# Patient Record
Sex: Male | Born: 1960 | Race: White | Hispanic: No | Marital: Married
Health system: Southern US, Community
[De-identification: ages and names within clinical notes are randomized; demographics above are authoritative.]

---

## 2009-03-27 ENCOUNTER — Emergency Department (HOSPITAL_COMMUNITY): Admission: EM | Admit: 2009-03-27 | Discharge: 2009-03-27 | Payer: Self-pay | Admitting: Emergency Medicine

## 2010-04-17 LAB — DIFFERENTIAL
Basophils Absolute: 0.1 10*3/uL (ref 0.0–0.1)
Eosinophils Absolute: 0.2 10*3/uL (ref 0.0–0.7)
Eosinophils Relative: 4 % (ref 0–5)
Neutro Abs: 3.9 10*3/uL (ref 1.7–7.7)

## 2010-04-17 LAB — COMPREHENSIVE METABOLIC PANEL
AST: 46 U/L — ABNORMAL HIGH (ref 0–37)
Albumin: 4 g/dL (ref 3.5–5.2)
BUN: 19 mg/dL (ref 6–23)
CO2: 28 mEq/L (ref 19–32)
Calcium: 9.1 mg/dL (ref 8.4–10.5)
Creatinine, Ser: 1.06 mg/dL (ref 0.4–1.5)
GFR calc non Af Amer: >60 mL/min (ref >60)
Glucose, Bld: 141 mg/dL — ABNORMAL HIGH (ref 70–99)
Total Protein: 6.5 g/dL (ref 6.0–8.3)

## 2010-04-17 LAB — URINALYSIS, ROUTINE W REFLEX MICROSCOPIC
Glucose, UA: NEGATIVE mg/dL
Leukocytes, UA: NEGATIVE
Nitrite: NEGATIVE
Urobilinogen, UA: 0.2 mg/dL (ref 0.0–1.0)

## 2010-04-17 LAB — URINE MICROSCOPIC-ADD ON

## 2010-04-17 LAB — CBC
MCHC: 33.9 g/dL (ref 30.0–36.0)
Platelets: 197 10*3/uL (ref 150–400)
RDW: 12.5 % (ref 11.5–15.5)

## 2010-04-17 LAB — LIPASE, BLOOD: Lipase: 48 U/L (ref 11–59)

## 2012-08-04 ENCOUNTER — Ambulatory Visit: Payer: Self-pay | Admitting: Psychology

## 2012-11-10 ENCOUNTER — Ambulatory Visit: Payer: Self-pay | Admitting: Psychology

## 2015-01-10 ENCOUNTER — Other Ambulatory Visit: Payer: Self-pay | Admitting: Nurse Practitioner

## 2015-01-10 DIAGNOSIS — R1032 Left lower quadrant pain: Secondary | ICD-10-CM

## 2015-01-14 ENCOUNTER — Ambulatory Visit
Admission: RE | Admit: 2015-01-14 | Discharge: 2015-01-14 | Disposition: A | Discharge status: Home / Self Care | Payer: BC Managed Care – PPO | Source: Ambulatory Visit | Attending: Nurse Practitioner | Admitting: Nurse Practitioner

## 2015-01-14 DIAGNOSIS — R1032 Left lower quadrant pain: Secondary | ICD-10-CM

## 2016-12-19 IMAGING — US US ABDOMEN COMPLETE
1 series · 13 of 25 positions shown · non-contrast
Comparison: 03/27/2009 CT abdomen/ pelvis.

CLINICAL DATA: Left lower quadrant and left back pain for 3 weeks.
Diarrhea.

EXAM:
ABDOMEN ULTRASOUND COMPLETE

[Series 1: us abdomen complete · 0.24mm/px · 13 of 84 slices shown]
[im 1/84]
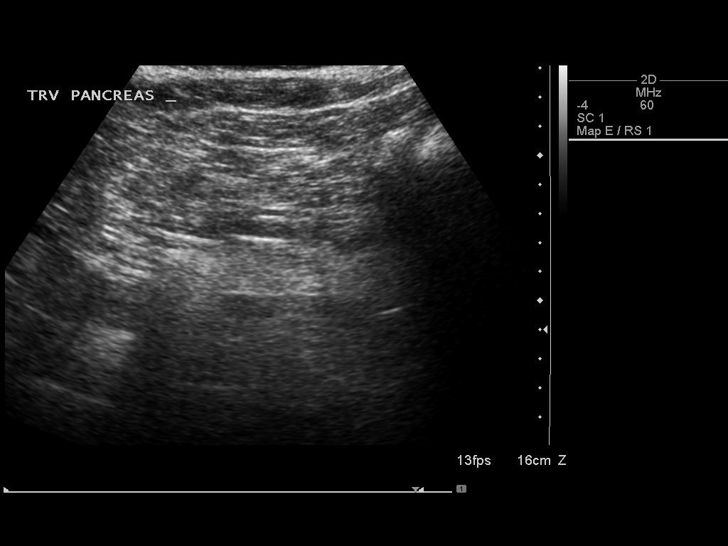
[im 7/84]
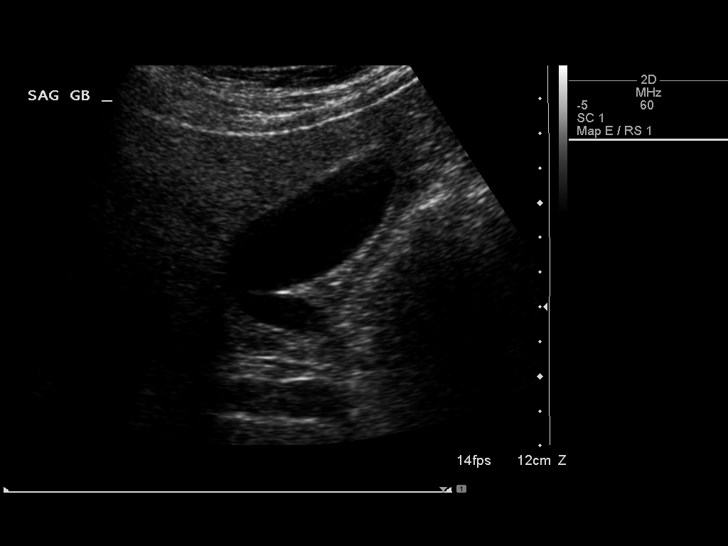
[im 14/84]
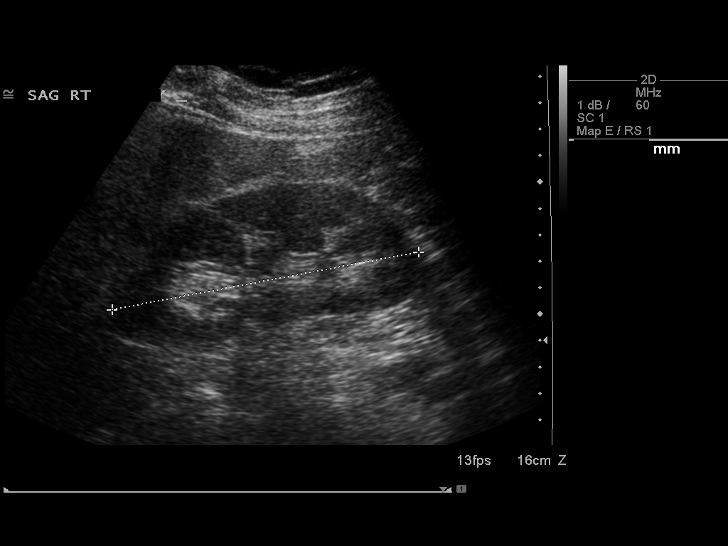
[im 21/84]
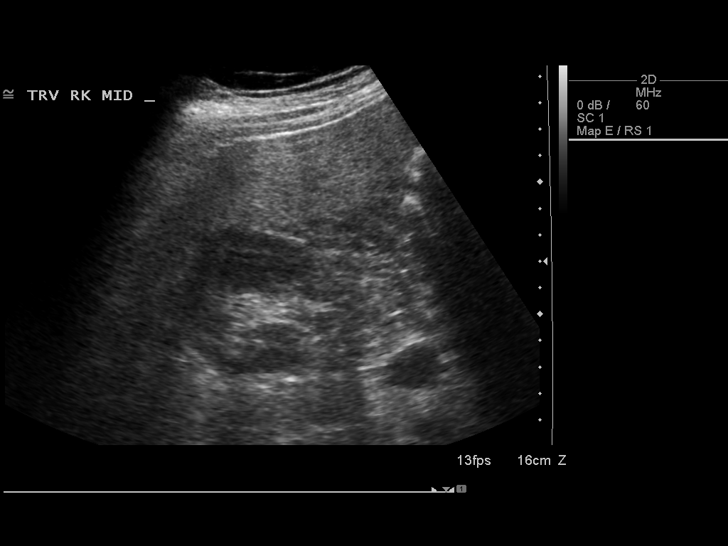
[im 28/84]
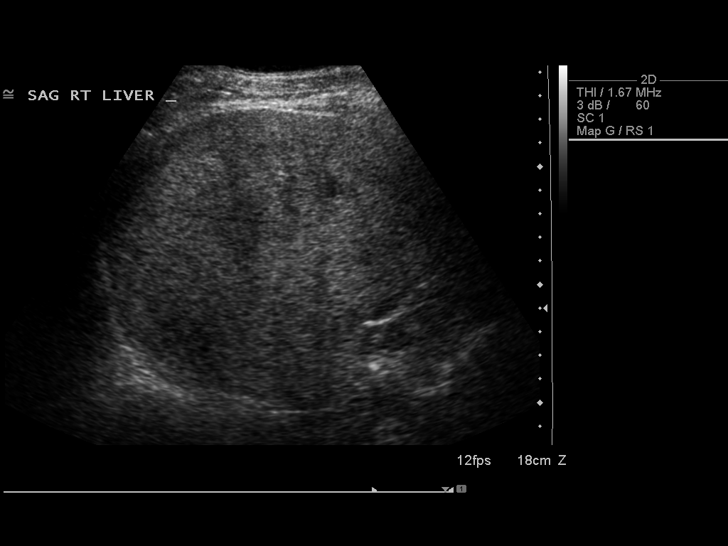
[im 35/84]
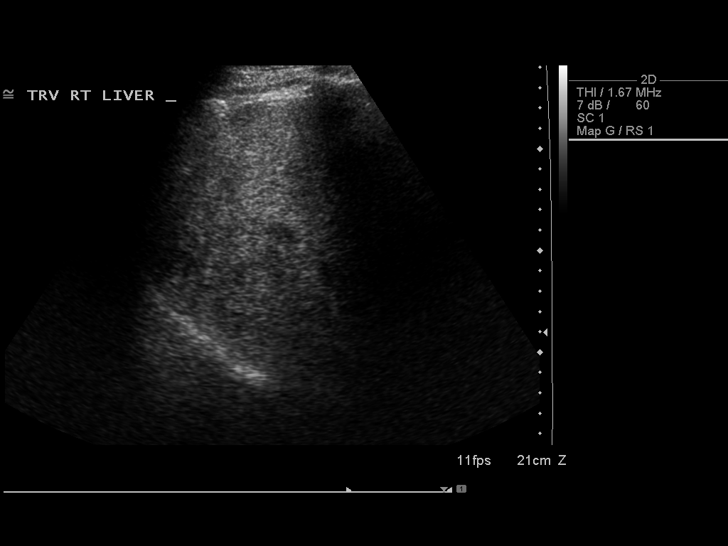
[im 42/84]
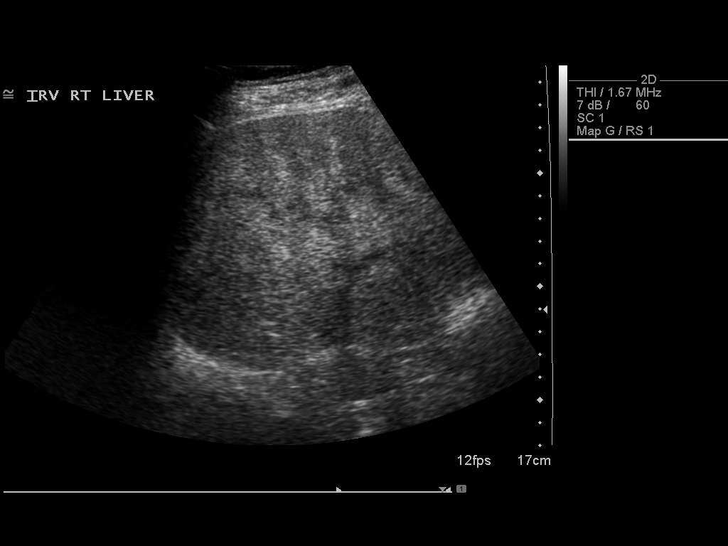
[im 49/84]
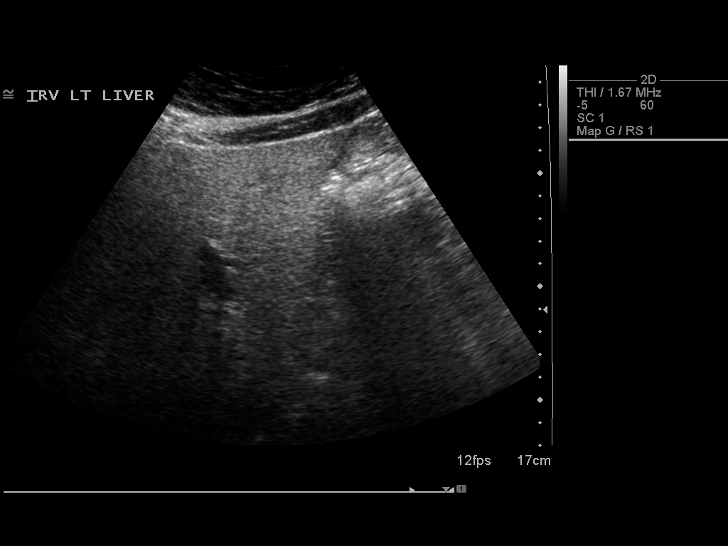
[im 56/84]
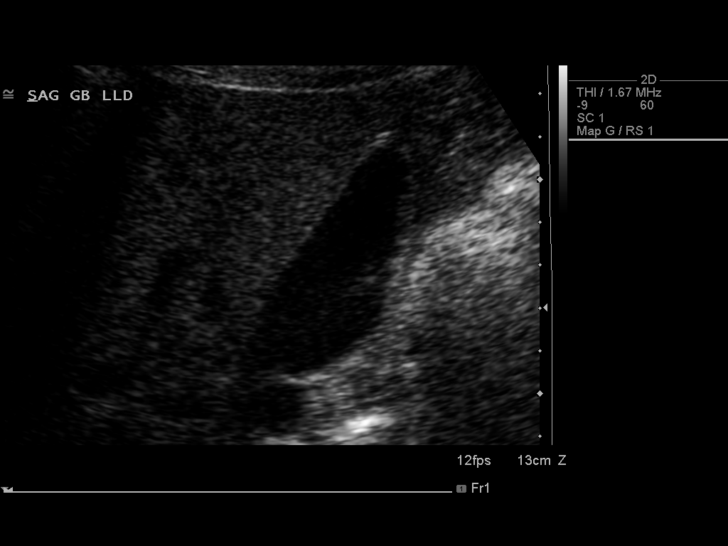
[im 63/84]
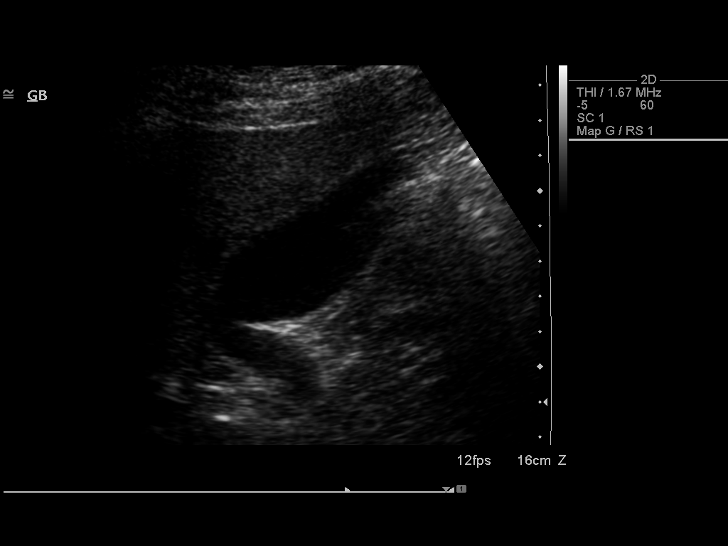
[im 70/84]
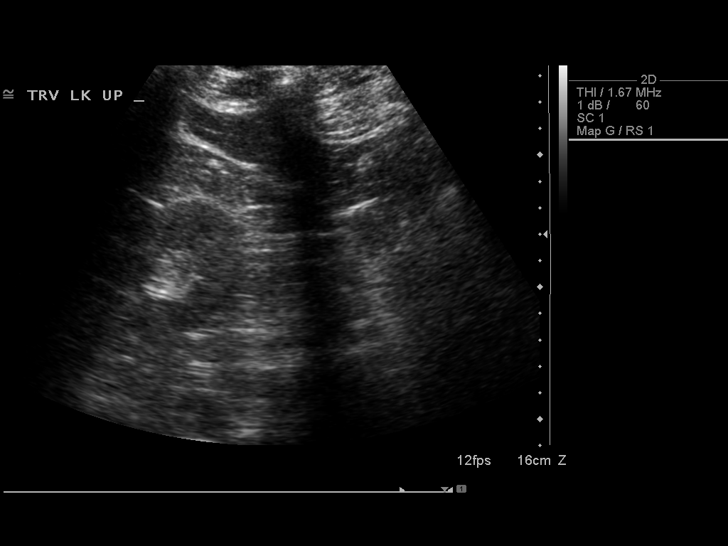
[im 77/84]
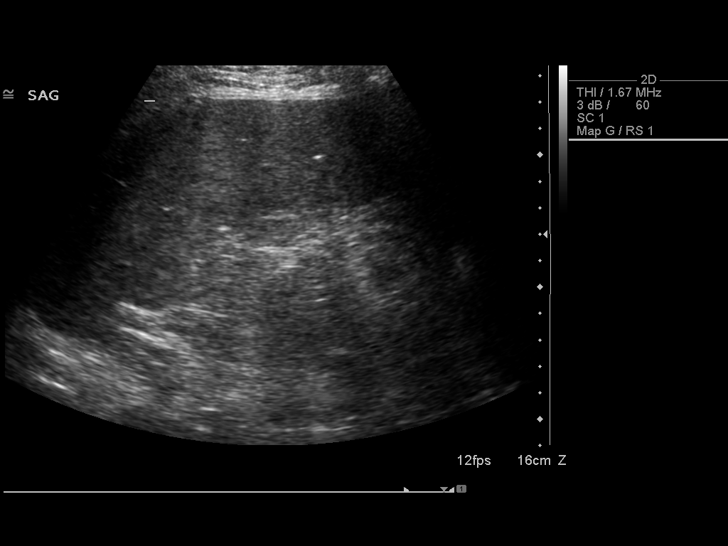
[im 84/84]
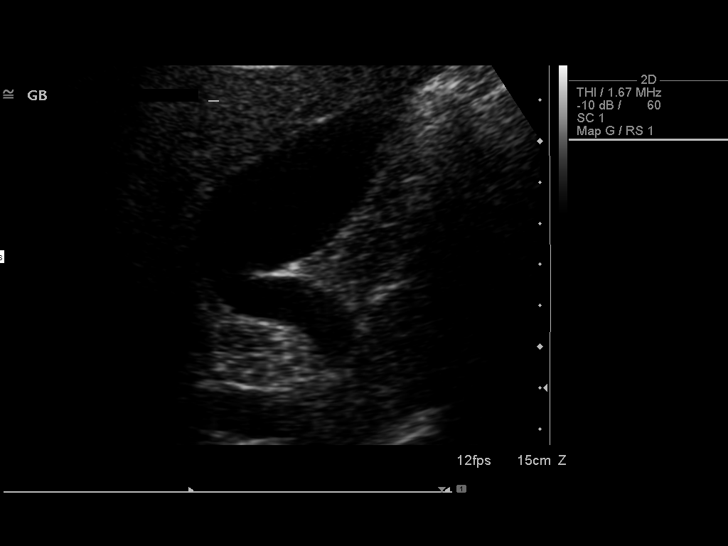

[13 of 25 positions shown; findings below may reference images not displayed]

FINDINGS: Gallbladder: No gallstones or wall thickening visualized. No
sonographic Murphy sign noted by sonographer.

Common bile duct: Diameter: 4 mm

Liver: Liver parenchyma is diffusely moderately to markedly
echogenic with prominent posterior acoustic attenuation, in keeping
with moderate to severe diffuse hepatic steatosis. No liver mass is
demonstrated, noting significantly decreased sensitivity in the
setting of moderate to severe steatosis. No liver surface
irregularity is demonstrated.

IVC: No abnormality visualized.

Pancreas: Visualized portion unremarkable.

Spleen: Mild enlargement of the spleen (craniocaudal splenic length
13.6 cm, unchanged since 03/27/2009). Scattered granulomatous
calcifications are seen throughout the spleen, unchanged. No splenic
mass.

Right Kidney: Length: 12.3 cm. Echogenicity within normal limits. No
mass or hydronephrosis visualized.

Left Kidney: Length: 13.0 cm. Echogenicity within normal limits. No
mass or hydronephrosis visualized.

Abdominal aorta: No aneurysm visualized.

Other findings: None.
IMPRESSION: 1. Moderate to severe diffuse hepatic steatosis.
2. Chronic mild splenic enlargement, stable since 3988. If the
patient is above-average in height, this could represent a normal
size spleen for this patient.
3. Otherwise normal abdominal sonogram.  No cholelithiasis.

## 2017-01-03 ENCOUNTER — Other Ambulatory Visit: Payer: Self-pay | Admitting: Geriatric Medicine

## 2017-01-03 DIAGNOSIS — R1011 Right upper quadrant pain: Secondary | ICD-10-CM

## 2017-01-04 ENCOUNTER — Ambulatory Visit
Admission: RE | Admit: 2017-01-04 | Discharge: 2017-01-04 | Disposition: A | Discharge status: Home / Self Care | Payer: BC Managed Care – PPO | Source: Ambulatory Visit | Attending: Geriatric Medicine | Admitting: Geriatric Medicine

## 2017-01-04 DIAGNOSIS — R1011 Right upper quadrant pain: Secondary | ICD-10-CM

## 2017-02-05 ENCOUNTER — Other Ambulatory Visit (HOSPITAL_COMMUNITY): Payer: Self-pay | Admitting: Internal Medicine

## 2017-02-05 ENCOUNTER — Ambulatory Visit (HOSPITAL_COMMUNITY)
Admission: RE | Admit: 2017-02-05 | Discharge: 2017-02-05 | Disposition: A | Discharge status: Home / Self Care | Payer: BC Managed Care – PPO | Source: Ambulatory Visit | Attending: Vascular Surgery | Admitting: Vascular Surgery

## 2017-02-05 ENCOUNTER — Other Ambulatory Visit: Payer: Self-pay | Admitting: Geriatric Medicine

## 2017-02-05 DIAGNOSIS — M79661 Pain in right lower leg: Secondary | ICD-10-CM

## 2019-11-09 IMAGING — US US ABDOMEN LIMITED
1 series · 13 of 25 positions shown · non-contrast
Comparison: Abdominal ultrasound January 14, 2015

CLINICAL DATA: Six week history of right upper quadrant pain

EXAM:
ULTRASOUND ABDOMEN LIMITED RIGHT UPPER QUADRANT

[Series 1: us abdomen limited · 0.22mm/px · 13 of 44 slices shown]
[im 1/44]
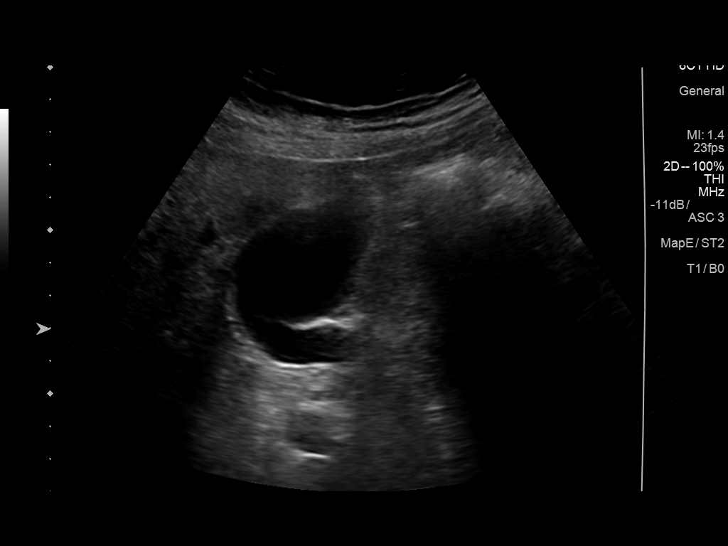
[im 4/44]
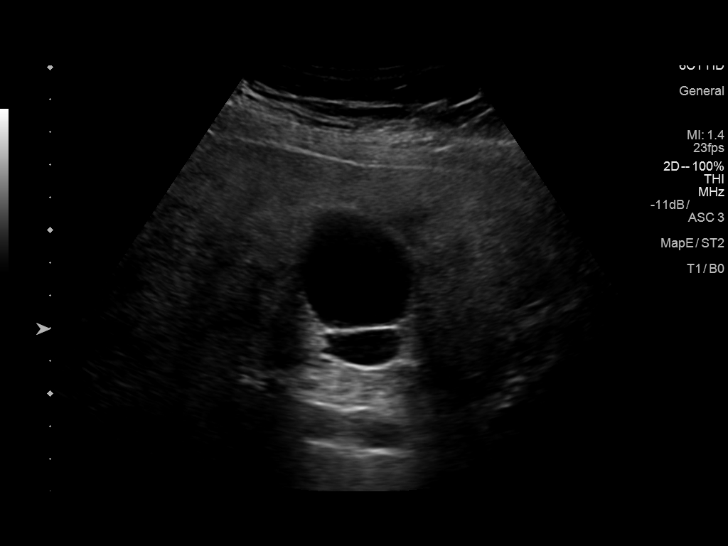
[im 8/44]
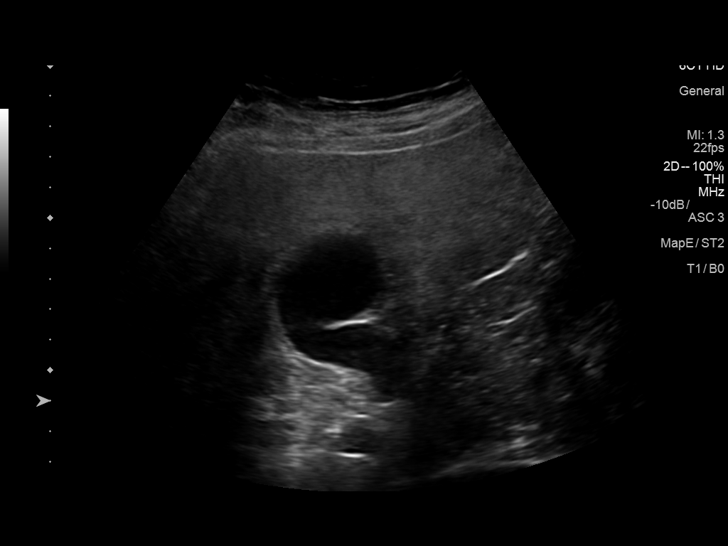
[im 11/44]
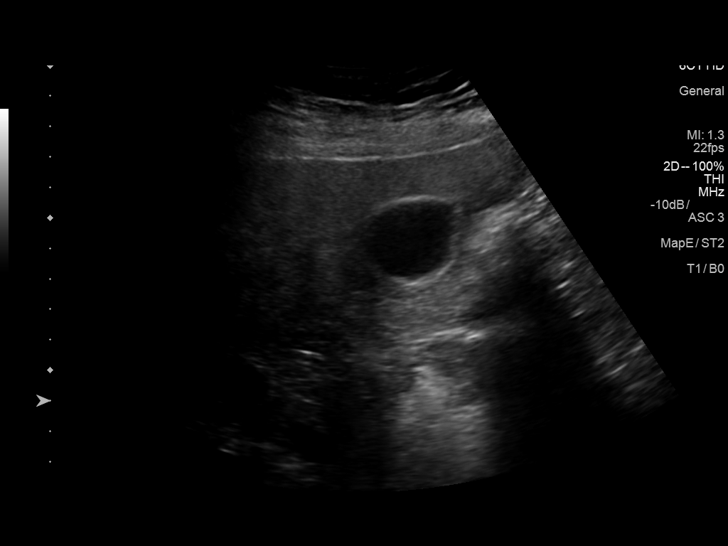
[im 15/44]
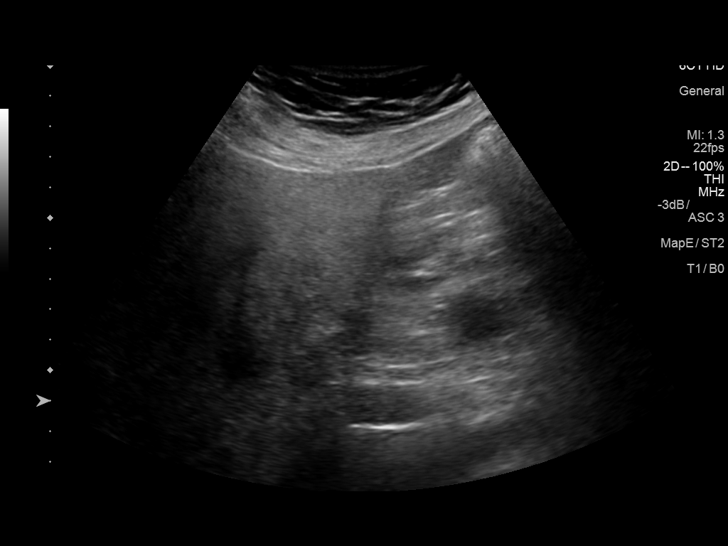
[im 18/44]
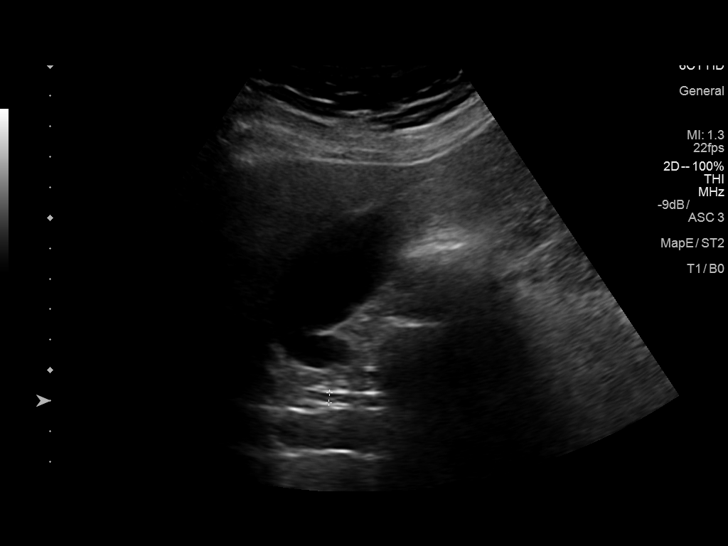
[im 22/44]
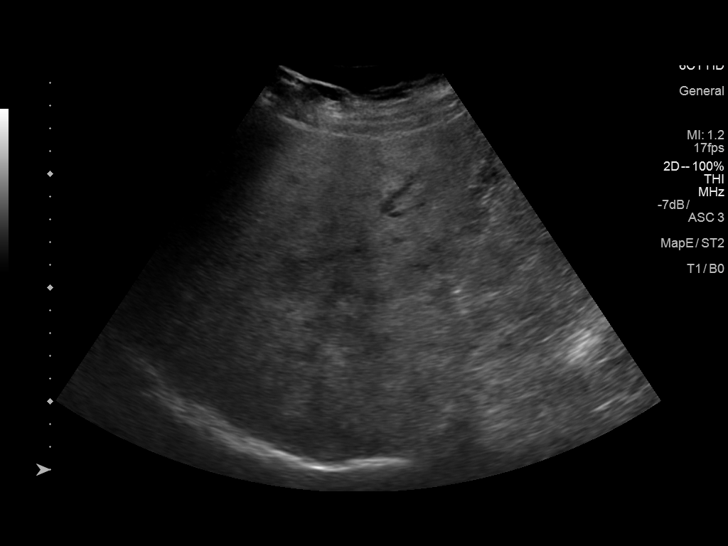
[im 26/44]
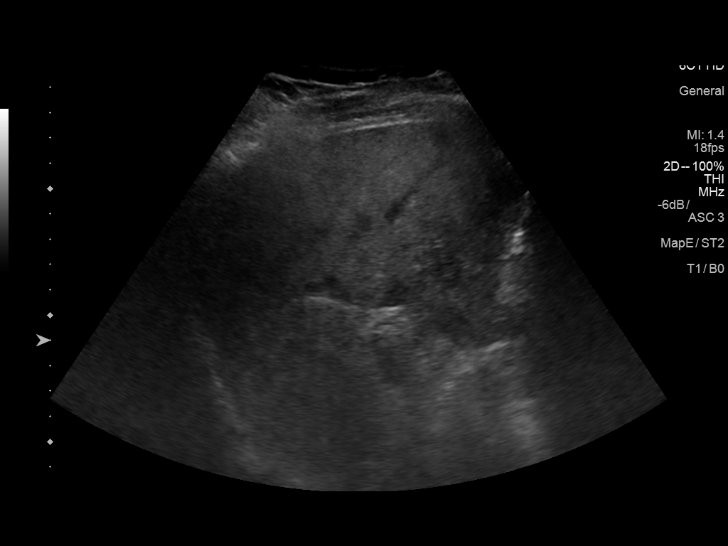
[im 29/44]
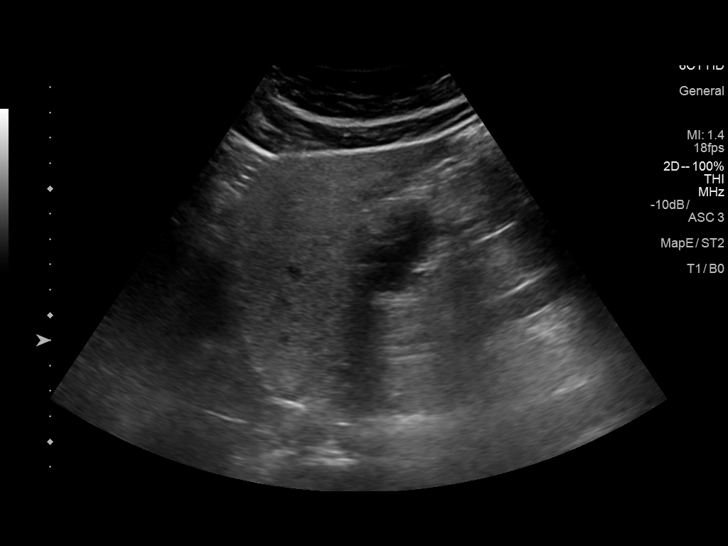
[im 33/44]
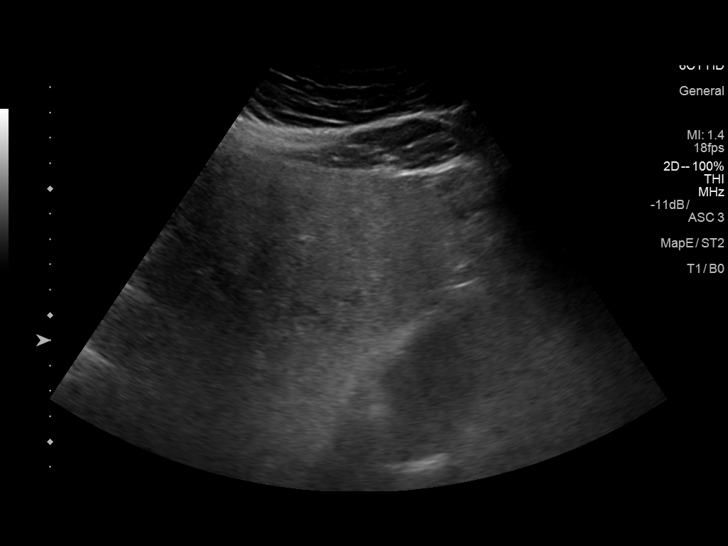
[im 36/44]
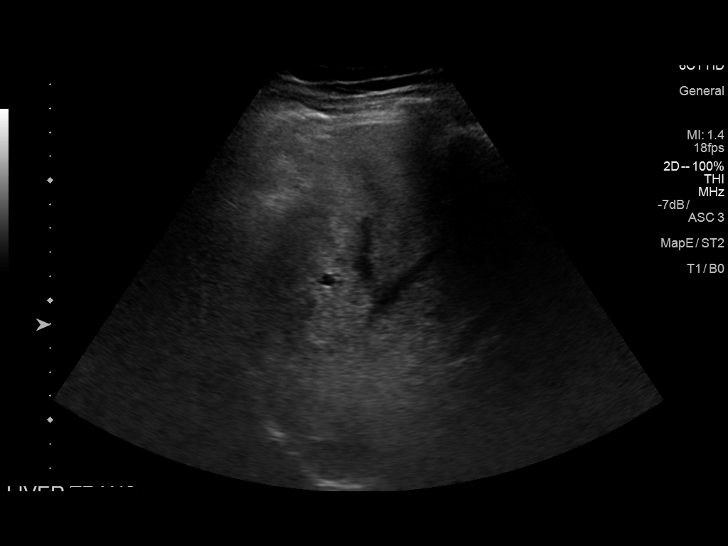
[im 40/44]
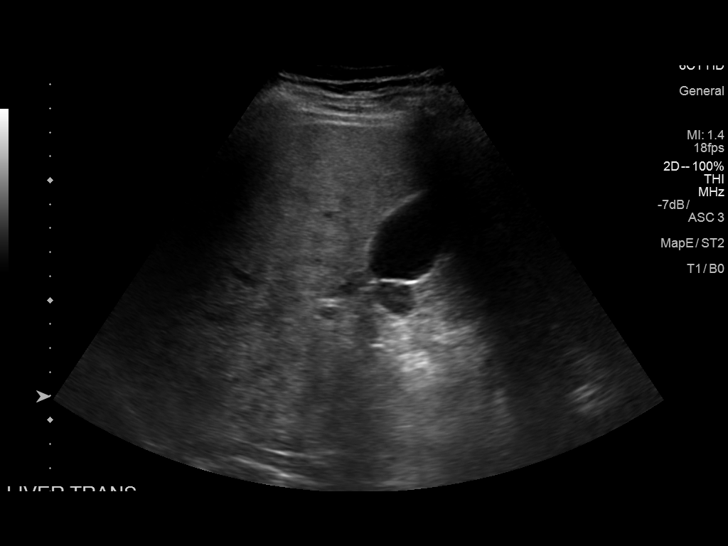
[im 44/44]
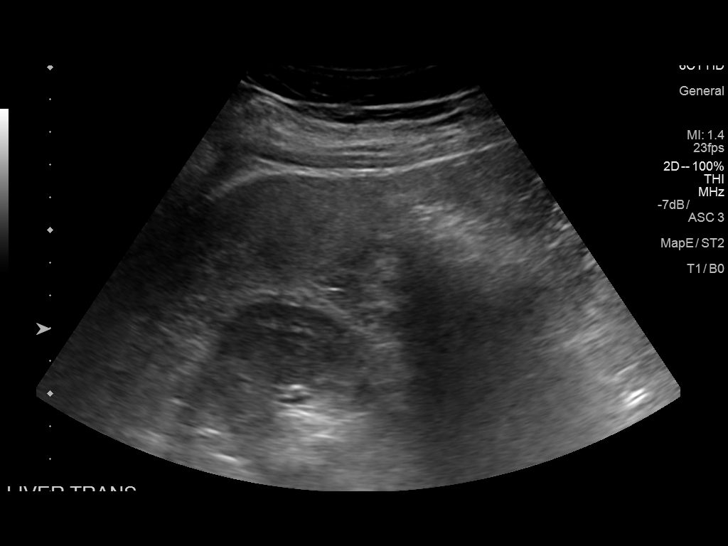

[13 of 25 positions shown; findings below may reference images not displayed]

FINDINGS: Gallbladder:

No gallstones or wall thickening visualized. There is no
pericholecystic fluid. No sonographic Murphy sign noted by
sonographer.

Common bile duct:

Diameter: 4 mm. There is no intrahepatic or extrahepatic biliary
duct dilatation.

Liver:

No focal lesion identified. Liver echogenicity is inhomogeneous and
overall increased. Liver contour is somewhat nodular. Portal vein is
patent on color Doppler imaging with normal direction of blood flow
towards the liver.
IMPRESSION: 1. Somewhat nodular contour of the liver, a finding that may be
indicative of a degree of hepatic steatosis. There is inhomogeneous
overall increased echotexture of the liver. This finding may be seen
with parenchymal liver disease such as cirrhosis, potentially with
underlying hepatic steatosis. It should be noted that while no focal
liver lesions are evident on this study, the sensitivity of
ultrasound for detection of focal liver lesions is diminished
significantly in this circumstance.

2.  Study otherwise unremarkable.

## 2019-12-25 ENCOUNTER — Ambulatory Visit: Payer: BC Managed Care – PPO | Attending: Internal Medicine

## 2019-12-25 DIAGNOSIS — Z23 Encounter for immunization: Secondary | ICD-10-CM

## 2019-12-25 NOTE — Progress Notes (Signed)
   [facility]  Name:  Mike Cross    MRN: [id number] DOB: 06-19-1960  12/25/2019  [name] was observed post Covid-19 immunization for 15 minutes without incident. He was provided with Vaccine Information Sheet and instruction to access the V-Safe system.   [name] was instructed to call [phone] with any severe reactions post vaccine: Marland Kitchen Difficulty breathing  . Swelling of face and throat  . A fast heartbeat  . A bad rash all over body  . Dizziness and weakness   Immunizations Administered    Name Date Dose VIS Date Route   Pfizer COVID-19 Vaccine 12/25/2019  3:21 PM 0.3 mL 11/11/2019 Intramuscular   Manufacturer: [employer], [employer]   Lot: [id number]   NDC: [address]-0

## 2022-08-01 ENCOUNTER — Other Ambulatory Visit (HOSPITAL_COMMUNITY): Payer: Self-pay | Admitting: Internal Medicine

## 2022-08-01 DIAGNOSIS — R109 Unspecified abdominal pain: Secondary | ICD-10-CM

## 2022-08-02 ENCOUNTER — Ambulatory Visit (HOSPITAL_COMMUNITY)
Admission: RE | Admit: 2022-08-02 | Discharge: 2022-08-02 | Disposition: A | Discharge status: Home / Self Care | Payer: BC Managed Care – PPO | Source: Ambulatory Visit | Attending: Internal Medicine | Admitting: Internal Medicine

## 2022-08-02 DIAGNOSIS — R109 Unspecified abdominal pain: Secondary | ICD-10-CM | POA: Insufficient documentation
